# Patient Record
Sex: Female | Born: 1960 | Race: Black or African American | Hispanic: No | Smoking: Never smoker
Health system: Southern US, Community
[De-identification: ages and names within clinical notes are randomized; demographics above are authoritative.]

---

## 1997-11-10 ENCOUNTER — Emergency Department (HOSPITAL_COMMUNITY): Admission: EM | Admit: 1997-11-10 | Discharge: 1997-11-10 | Payer: Self-pay | Admitting: Family Medicine

## 1999-10-31 ENCOUNTER — Encounter: Payer: Self-pay | Admitting: Emergency Medicine

## 1999-10-31 ENCOUNTER — Emergency Department (HOSPITAL_COMMUNITY): Admission: EM | Admit: 1999-10-31 | Discharge: 1999-10-31 | Payer: Self-pay | Admitting: Emergency Medicine

## 1999-12-23 ENCOUNTER — Emergency Department (HOSPITAL_COMMUNITY): Admission: EM | Admit: 1999-12-23 | Discharge: 1999-12-24 | Payer: Self-pay | Admitting: Emergency Medicine

## 2001-10-12 ENCOUNTER — Emergency Department (HOSPITAL_COMMUNITY): Admission: EM | Admit: 2001-10-12 | Discharge: 2001-10-12 | Payer: Self-pay | Admitting: Emergency Medicine

## 2001-10-21 ENCOUNTER — Other Ambulatory Visit: Admission: RE | Admit: 2001-10-21 | Discharge: 2001-10-21 | Payer: Self-pay | Admitting: Obstetrics and Gynecology

## 2003-03-14 ENCOUNTER — Other Ambulatory Visit: Admission: RE | Admit: 2003-03-14 | Discharge: 2003-03-14 | Payer: Self-pay | Admitting: *Deleted

## 2010-02-08 ENCOUNTER — Emergency Department (HOSPITAL_COMMUNITY)
Admission: EM | Admit: 2010-02-08 | Discharge: 2010-02-09 | Payer: Self-pay | Source: Home / Self Care | Admitting: Emergency Medicine

## 2010-02-08 ENCOUNTER — Encounter: Payer: Self-pay | Admitting: *Deleted

## 2012-08-19 ENCOUNTER — Encounter (HOSPITAL_COMMUNITY): Payer: Self-pay | Admitting: Emergency Medicine

## 2012-08-19 ENCOUNTER — Emergency Department (HOSPITAL_COMMUNITY): Payer: Self-pay

## 2012-08-19 ENCOUNTER — Emergency Department (HOSPITAL_COMMUNITY)
Admission: EM | Admit: 2012-08-19 | Discharge: 2012-08-19 | Disposition: A | Discharge status: Home / Self Care | Payer: Self-pay | Attending: Emergency Medicine | Admitting: Emergency Medicine

## 2012-08-19 DIAGNOSIS — T148XXA Other injury of unspecified body region, initial encounter: Secondary | ICD-10-CM

## 2012-08-19 DIAGNOSIS — W010XXA Fall on same level from slipping, tripping and stumbling without subsequent striking against object, initial encounter: Secondary | ICD-10-CM | POA: Insufficient documentation

## 2012-08-19 DIAGNOSIS — S300XXA Contusion of lower back and pelvis, initial encounter: Secondary | ICD-10-CM | POA: Insufficient documentation

## 2012-08-19 DIAGNOSIS — Y939 Activity, unspecified: Secondary | ICD-10-CM | POA: Insufficient documentation

## 2012-08-19 DIAGNOSIS — S43401A Unspecified sprain of right shoulder joint, initial encounter: Secondary | ICD-10-CM

## 2012-08-19 DIAGNOSIS — Y92009 Unspecified place in unspecified non-institutional (private) residence as the place of occurrence of the external cause: Secondary | ICD-10-CM | POA: Insufficient documentation

## 2012-08-19 DIAGNOSIS — IMO0002 Reserved for concepts with insufficient information to code with codable children: Secondary | ICD-10-CM | POA: Insufficient documentation

## 2012-08-19 MED ORDER — TRAMADOL HCL 50 MG PO TABS: 50.0000 mg | ORAL_TABLET | Freq: Four times a day (QID) | ORAL | Status: AC | PRN

## 2012-08-19 MED ORDER — NAPROXEN 500 MG PO TABS: 500.0000 mg | ORAL_TABLET | Freq: Two times a day (BID) | ORAL | Status: AC

## 2012-08-19 MED ORDER — METHOCARBAMOL 500 MG PO TABS: 1000.0000 mg | ORAL_TABLET | Freq: Four times a day (QID) | ORAL | Status: DC

## 2012-08-19 NOTE — ED Notes (Signed)
Pt c/o fall yesterday in shower and c/o right arm and right buttocks pain after fall

## 2012-08-19 NOTE — ED Provider Notes (Signed)
CSN: [id number]     Arrival date & time 08/19/12  1545 History  This chart was scribed for non-physician practitioner Rhea Bleacher, PA-C, working with Gilda Crease, by Yevette Edwards, ED Scribe. This patient was seen in room TR09C/TR09C and the patient's care was started at 4:11 PM.   First MD Initiated Contact with Patient 08/19/12 1603     Chief Complaint  Patient presents with  . Fall    The history is provided by the patient. No language interpreter was used.   HPI Comments: Tanya Curtis is a 52 y.o. female who presents to the Emergency Department complaining of a fall which occurred yesterday morning. She injured her right shoulder, right side of her back, and buttocks on her tub as she fell. The pt states that the right shoulder and buttocks causes her the most pain, and that she noticed the pain more today than yesterday. She denies experiencing any numbness or tingling to her arms or legs. She denies hitting her head in the fall or any LOC. She reports walking more gingerly since the fall. She denies attempting to mitigate the pain with any medication, icing, or heat. The onset of this condition is acute. The course is constant. The aggravating factors are ambulation. The alleviating factors are none tried.   History reviewed. No pertinent past medical history. History reviewed. No pertinent past surgical history. History reviewed. No pertinent family history. History  Substance Use Topics  . Smoking status: Never Smoker   . Smokeless tobacco: Not on file  . Alcohol Use: No   No OB history provided.  Review of Systems  Constitutional: Negative for fatigue.  HENT: Negative for neck pain and tinnitus.   Eyes: Negative for photophobia, pain and visual disturbance.  Respiratory: Negative for shortness of breath.   Cardiovascular: Negative for chest pain.  Gastrointestinal: Negative for nausea and vomiting.  Musculoskeletal: Positive for back pain (Right-sided. ) and  arthralgias (Right shoulder and butocks. ). Negative for gait problem.  Skin: Negative for wound.  Neurological: Negative for dizziness, syncope, weakness, light-headedness, numbness and headaches.  Psychiatric/Behavioral: Negative for confusion and decreased concentration.    Allergies  Review of patient's allergies indicates no known allergies.  Home Medications   Current Outpatient Rx  Name  Route  Sig  Dispense  Refill  . methocarbamol (ROBAXIN) 500 MG tablet   Oral   Take 2 tablets (1,000 mg total) by mouth 4 (four) times daily.   20 tablet   0   . naproxen (NAPROSYN) 500 MG tablet   Oral   Take 1 tablet (500 mg total) by mouth 2 (two) times daily.   20 tablet   0   . traMADol (ULTRAM) 50 MG tablet   Oral   Take 1 tablet (50 mg total) by mouth every 6 (six) hours as needed for pain.   15 tablet   0     Triage Vitals: BP 140/79  Pulse 94  Temp(Src) 98 F (36.7 C) (Oral)  Resp 18  SpO2 99%  Physical Exam  Nursing note and vitals reviewed. Constitutional: She appears well-developed and well-nourished.  HENT:  Head: Normocephalic and atraumatic.  Eyes: Pupils are equal, round, and reactive to light.  Neck: Normal range of motion. Neck supple.  Cardiovascular: Exam reveals no decreased pulses.   Musculoskeletal: She exhibits tenderness. She exhibits no edema.       Right shoulder: She exhibits tenderness and bony tenderness. She exhibits normal range of motion.  Right hip: Normal.       Left hip: Normal.       Cervical back: Normal. She exhibits normal range of motion and no tenderness.       Thoracic back: Normal. She exhibits normal range of motion and no tenderness.       Lumbar back: Normal. She exhibits normal range of motion and no tenderness.       Arms:      Legs: Neurological: She is alert. No sensory deficit.  Motor, sensation, and vascular distal to the injury is fully intact.   Skin: Skin is warm and dry.  Psychiatric: She has a normal mood  and affect.    ED Course   DIAGNOSTIC STUDIES: Oxygen Saturation is 99% on room air, normal by my interpretation.    COORDINATION OF CARE:  4:16 PM- Discussed treatment plan with patient which includes imaging, and the patient agreed to the plan.   Procedures (including critical care time)  Labs Reviewed - No data to display Dg Shoulder Right  08/19/2012   *RADIOLOGY REPORT*  Clinical Data: Right shoulder pain after fall  RIGHT SHOULDER - 2+ VIEW  Comparison: None.  Findings: No fracture or dislocation is noted.  Joint spaces are intact. No soft tissue abnormality is noted.  Visualized ribs appear normal.  IMPRESSION: Normal right shoulder.   Original Report Authenticated By: Lupita Raider.,  M.D.   1. Shoulder sprain, right, initial encounter   2. Contusion    Patient counseled on use of narcotic pain medications. Counseled not to combine these medications with others containing tylenol. Urged not to drink alcohol, drive, or perform any other activities that requires focus while taking these medications. The patient verbalizes understanding and agrees with the plan.  Vital signs reviewed and are as follows: Filed Vitals:   08/19/12 1551  BP: 140/79  Pulse: 94  Temp: 98 F (36.7 C)  Resp: 18      MDM  Shoulder pain: X-rays negative. Will treat with splint. Treat as sprain. Counseled on rice protocol.   Buttocks pain: Suspect soft tissue pain, contusion. Patient ambulatory without difficulty. No concern for hip or leg fracture. Lower extremity neurovascularly intact without signs of compartment syndrome.   I personally performed the services described in this documentation, which was scribed in my presence. The recorded information has been reviewed and is accurate.    Renne Crigler, PA-C 08/19/12 [phone]

## 2012-08-20 NOTE — ED Provider Notes (Signed)
Medical screening examination/treatment/procedure(s) were performed by non-physician practitioner and as supervising physician I was immediately available for consultation/collaboration.   Christopher J. Pollina, MD 08/20/12 1746 

## 2012-08-22 ENCOUNTER — Emergency Department (HOSPITAL_COMMUNITY): Payer: Self-pay

## 2012-08-22 ENCOUNTER — Encounter (HOSPITAL_COMMUNITY): Payer: Self-pay | Admitting: Nurse Practitioner

## 2012-08-22 ENCOUNTER — Emergency Department (HOSPITAL_COMMUNITY)
Admission: EM | Admit: 2012-08-22 | Discharge: 2012-08-22 | Disposition: A | Discharge status: Home / Self Care | Payer: Self-pay | Attending: Emergency Medicine | Admitting: Emergency Medicine

## 2012-08-22 DIAGNOSIS — W1809XA Striking against other object with subsequent fall, initial encounter: Secondary | ICD-10-CM | POA: Insufficient documentation

## 2012-08-22 DIAGNOSIS — Y9389 Activity, other specified: Secondary | ICD-10-CM | POA: Insufficient documentation

## 2012-08-22 DIAGNOSIS — S8990XA Unspecified injury of unspecified lower leg, initial encounter: Secondary | ICD-10-CM | POA: Insufficient documentation

## 2012-08-22 DIAGNOSIS — IMO0002 Reserved for concepts with insufficient information to code with codable children: Secondary | ICD-10-CM | POA: Insufficient documentation

## 2012-08-22 DIAGNOSIS — S8991XA Unspecified injury of right lower leg, initial encounter: Secondary | ICD-10-CM

## 2012-08-22 DIAGNOSIS — Y929 Unspecified place or not applicable: Secondary | ICD-10-CM | POA: Insufficient documentation

## 2012-08-22 DIAGNOSIS — Z79899 Other long term (current) drug therapy: Secondary | ICD-10-CM | POA: Insufficient documentation

## 2012-08-22 DIAGNOSIS — R42 Dizziness and giddiness: Secondary | ICD-10-CM | POA: Insufficient documentation

## 2012-08-22 DIAGNOSIS — S00211A Abrasion of right eyelid and periocular area, initial encounter: Secondary | ICD-10-CM

## 2012-08-22 DIAGNOSIS — R55 Syncope and collapse: Secondary | ICD-10-CM | POA: Insufficient documentation

## 2012-08-22 LAB — POCT I-STAT, CHEM 8
BUN: 6 mg/dL (ref 6–23)
Chloride: 101 mEq/L (ref 96–112)
HCT: 39 % (ref 36.0–46.0)
Potassium: 3.6 mEq/L (ref 3.5–5.1)

## 2012-08-22 LAB — CBC WITH DIFFERENTIAL/PLATELET
Basophils Absolute: 0 10*3/uL (ref 0.0–0.1)
HCT: 33.9 % — ABNORMAL LOW (ref 36.0–46.0)
Hemoglobin: 11.5 g/dL — ABNORMAL LOW (ref 12.0–15.0)
Lymphocytes Relative: 45 % (ref 12–46)
Lymphs Abs: 4 10*3/uL (ref 0.7–4.0)
MCV: 86.9 fL (ref 78.0–100.0)
Monocytes Absolute: 0.8 10*3/uL (ref 0.1–1.0)
Monocytes Relative: 9 % (ref 3–12)
Neutro Abs: 3.8 10*3/uL (ref 1.7–7.7)
RBC: 3.9 MIL/uL (ref 3.87–5.11)
WBC: 8.8 10*3/uL (ref 4.0–10.5)

## 2012-08-22 LAB — GLUCOSE, CAPILLARY: Glucose-Capillary: 111 mg/dL — ABNORMAL HIGH (ref 70–99)

## 2012-08-22 MED ORDER — IBUPROFEN 800 MG PO TABS
800.0000 mg | ORAL_TABLET | Freq: Once | ORAL | Status: AC
  Administered 2012-08-22: 800 mg via ORAL
  Filled 2012-08-22: qty 1

## 2012-08-22 NOTE — ED Notes (Signed)
Pt refuses ibuprofen and wants to take it after CT scan.

## 2012-08-22 NOTE — ED Notes (Signed)
Pt reports she stood up from sitting position today and felt lightheaded and fell landing on R side of face. R sided facial swelling, 2 lacerations to R eyelid, and pain to R knee. Pt denies LOC. Pt is A&Ox4 now, ambulatory

## 2012-08-22 NOTE — ED Provider Notes (Signed)
CSN: [id number]     Arrival date & time 08/22/12  1508 History     First MD Initiated Contact with Patient 08/22/12 1831     Chief Complaint  Patient presents with  . Fall   (Consider location/radiation/quality/duration/timing/severity/associated sxs/prior Treatment) The history is provided by the patient.  Tanya Curtis is a 52 y.o. female here with dizziness and fall. She got up sitting position and suddenly felt lightheaded and dizzy and may have passed out and hit R face. Denies chest pain or shortness of breath. She hit her head and R knee as well and has abrasion R eyebrow. Tetanus up to date.    History reviewed. No pertinent past medical history. History reviewed. No pertinent past surgical history. History reviewed. No pertinent family history. History  Substance Use Topics  . Smoking status: Never Smoker   . Smokeless tobacco: Not on file  . Alcohol Use: No   OB History   Grav Para Term Preterm Abortions TAB SAB Ect Mult Living                 Review of Systems  Musculoskeletal:       R eyebrow abrasion   Neurological: Positive for dizziness and syncope.  All other systems reviewed and are negative.    Allergies  Review of patient's allergies indicates no known allergies.  Home Medications   Current Outpatient Rx  Name  Route  Sig  Dispense  Refill  . methocarbamol (ROBAXIN) 500 MG tablet   Oral   Take 1,000 mg by mouth 4 (four) times daily.         . naproxen (NAPROSYN) 500 MG tablet   Oral   Take 1 tablet (500 mg total) by mouth 2 (two) times daily.   20 tablet   0   . traMADol (ULTRAM) 50 MG tablet   Oral   Take 1 tablet (50 mg total) by mouth every 6 (six) hours as needed for pain.   15 tablet   0    BP 157/86  Pulse 89  Temp(Src) 98.3 F (36.8 C) (Oral)  Resp 16  SpO2 100%  LMP 08/04/2012 Physical Exam  Nursing note and vitals reviewed. Constitutional: She is oriented to person, place, and time. She appears well-developed and  well-nourished.  HENT:  Head: Normocephalic.  Mouth/Throat: Oropharynx is clear and moist.  R eyebrow slightly swollen and tender. Small abrasion R eyelid and R cheek. Jaw doesn't appear dislocated.   Eyes: Conjunctivae are normal. Pupils are equal, round, and reactive to light.  Neck: Normal range of motion. Neck supple.  No midline tenderness   Cardiovascular: Normal rate, regular rhythm and normal heart sounds.   Pulmonary/Chest: Effort normal and breath sounds normal. No respiratory distress. She has no wheezes. She has no rales.  Abdominal: Soft. Bowel sounds are normal. She exhibits no distension. There is no tenderness. There is no rebound and no guarding.  Musculoskeletal: Normal range of motion.  R knee dec ROM. Mild tenderness on knee.   Neurological: She is alert and oriented to person, place, and time.  Skin: Skin is warm and dry.  Psychiatric: She has a normal mood and affect. Her behavior is normal. Judgment and thought content normal.    ED Course   Procedures (including critical care time)  Labs Reviewed  GLUCOSE, CAPILLARY - Abnormal; Notable for the following:    Glucose-Capillary 111 (*)    All other components within normal limits  CBC WITH DIFFERENTIAL - Abnormal;  Notable for the following:    Hemoglobin 11.5 (*)    HCT 33.9 (*)    All other components within normal limits  POCT I-STAT, CHEM 8 - Abnormal; Notable for the following:    Glucose, Bld 113 (*)    All other components within normal limits  POCT I-STAT TROPONIN I   Ct Head Wo Contrast  08/22/2012   *RADIOLOGY REPORT*  Clinical Data:  FALL, SYNCOPAL EPISODE, LACERATION RIGHT ORBIT  CT HEAD WITHOUT CONTRAST CT MAXILLOFACIAL WITHOUT CONTRAST  Technique:  Multidetector CT imaging of the head and maxillofacial structures were performed using the standard protocol without intravenous contrast. Multiplanar CT image reconstructions of the maxillofacial structures were also generated.  Comparison:   None.  CT  HEAD  Findings: Metallic artifact related to the metal within the patient's hair.  This creates streak artifact.  Within the limits the study, no acute intracranial hemorrhage, definite infarction, mass lesion, midline shift, herniation, hydrocephalus, or extra-axial fluid collection.  Gray-white matter differentiation maintained. Cisterns patent.  No cerebellar abnormality.  Symmetric orbits. Mastoids and sinuses clear.  IMPRESSION: No acute intracranial finding.  Limited exam with artifact.  CT MAXILLOFACIAL  Findings:   Minor right orbital and maxillary soft tissue swelling/bruising.  Dental hardware creates streak artifact.  No definite displaced facial fracture.  Symmetric orbits.  Mandible, maxilla, zygomas, skull base, orbits, nasal septum, and nasal bones all appear intact.  Sinuses remain clear.  No orbital blowout fracture.  IMPRESSION: No acute facial bony trauma or displaced fracture.  Minor right orbital and maxillary soft tissue swelling/bruising.  No sinus opacification or hemorrhage.  Sinuses clear.   Original Report Authenticated By: Judie Petit. Shick, M.D.   Dg Knee Complete 4 Views Right  08/22/2012   *RADIOLOGY REPORT*  Clinical Data: Anterior knee pain status post fall today  RIGHT KNEE - COMPLETE 4+ VIEW  Comparison: None.  Findings: There are degenerative joint changes of right knee with narrowed joint space and osteophytosis.  On the frontal view, there is cortical discontinuity with lucency in the distal femur. This is not confirmed on the three other views.  IMPRESSION:  On the frontal view, there is cortical discontinuity with lucency in the distal femur. This is not confirmed on the three other views. This could be due to chronic change but if the patient has focal pain here, fracture is not as occluded.  Osteoarthritic changes of right knee.   Original Report Authenticated By: Sherian Rein, M.D.   Ct Maxillofacial Wo Cm  08/22/2012   *RADIOLOGY REPORT*  Clinical Data:  FALL, SYNCOPAL EPISODE,  LACERATION RIGHT ORBIT  CT HEAD WITHOUT CONTRAST CT MAXILLOFACIAL WITHOUT CONTRAST  Technique:  Multidetector CT imaging of the head and maxillofacial structures were performed using the standard protocol without intravenous contrast. Multiplanar CT image reconstructions of the maxillofacial structures were also generated.  Comparison:   None.  CT HEAD  Findings: Metallic artifact related to the metal within the patient's hair.  This creates streak artifact.  Within the limits the study, no acute intracranial hemorrhage, definite infarction, mass lesion, midline shift, herniation, hydrocephalus, or extra-axial fluid collection.  Gray-white matter differentiation maintained. Cisterns patent.  No cerebellar abnormality.  Symmetric orbits. Mastoids and sinuses clear.  IMPRESSION: No acute intracranial finding.  Limited exam with artifact.  CT MAXILLOFACIAL  Findings:   Minor right orbital and maxillary soft tissue swelling/bruising.  Dental hardware creates streak artifact.  No definite displaced facial fracture.  Symmetric orbits.  Mandible, maxilla, zygomas, skull base, orbits,  nasal septum, and nasal bones all appear intact.  Sinuses remain clear.  No orbital blowout fracture.  IMPRESSION: No acute facial bony trauma or displaced fracture.  Minor right orbital and maxillary soft tissue swelling/bruising.  No sinus opacification or hemorrhage.  Sinuses clear.   Original Report Authenticated By: Judie Petit. Miles Costain, M.D.   No diagnosis found.   Date: 08/22/2012  Rate: 86  Rhythm: normal sinus rhythm  QRS Axis: normal  Intervals: normal  ST/T Wave abnormalities: nonspecific ST changes  Conduction Disutrbances:none  Narrative Interpretation:   Old EKG Reviewed: none available    MDM  Tanya Curtis is a 52 y.o. female here with syncope. EKG unremarkable. Labs nl. CT head/face showed no fracture. I cleaned up abrasion and there is no laceration. Xray R knee showed arthritis but possible fracture, correlate  clinically. Patient has no pain on ambulation and doesn't want knee immobilizer or crutches. She will f/u with ortho to get repeat xray.     Richardean Canal, MD 08/22/12 [phone]

## 2014-05-09 ENCOUNTER — Encounter (HOSPITAL_COMMUNITY): Payer: Self-pay | Admitting: *Deleted

## 2014-05-09 ENCOUNTER — Emergency Department (HOSPITAL_COMMUNITY)
Admission: EM | Admit: 2014-05-09 | Discharge: 2014-05-09 | Disposition: A | Discharge status: Home / Self Care | Payer: No Typology Code available for payment source | Attending: Emergency Medicine | Admitting: Emergency Medicine

## 2014-05-09 DIAGNOSIS — S161XXA Strain of muscle, fascia and tendon at neck level, initial encounter: Secondary | ICD-10-CM | POA: Insufficient documentation

## 2014-05-09 DIAGNOSIS — S0990XA Unspecified injury of head, initial encounter: Secondary | ICD-10-CM | POA: Insufficient documentation

## 2014-05-09 DIAGNOSIS — Y9389 Activity, other specified: Secondary | ICD-10-CM | POA: Diagnosis not present

## 2014-05-09 DIAGNOSIS — Z791 Long term (current) use of non-steroidal anti-inflammatories (NSAID): Secondary | ICD-10-CM | POA: Insufficient documentation

## 2014-05-09 DIAGNOSIS — Y92410 Unspecified street and highway as the place of occurrence of the external cause: Secondary | ICD-10-CM | POA: Insufficient documentation

## 2014-05-09 DIAGNOSIS — Z79899 Other long term (current) drug therapy: Secondary | ICD-10-CM | POA: Diagnosis not present

## 2014-05-09 DIAGNOSIS — Y998 Other external cause status: Secondary | ICD-10-CM | POA: Diagnosis not present

## 2014-05-09 DIAGNOSIS — S199XXA Unspecified injury of neck, initial encounter: Secondary | ICD-10-CM | POA: Diagnosis present
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
## 2014-08-18 IMAGING — CT CT HEAD W/O CM
3 of 5 series · 16 of 47 positions shown, 19 images · non-contrast
Comparison: None.

CT HEAD

CLINICAL DATA: FALL, SYNCOPAL EPISODE, LACERATION RIGHT ORBIT

CT HEAD WITHOUT CONTRAST
CT MAXILLOFACIAL WITHOUT CONTRAST
TECHNIQUE: Multidetector CT imaging of the head and maxillofacial
structures were performed using the standard protocol without
intravenous contrast. Multiplanar CT image reconstructions of the
maxillofacial structures were also generated.

[Series 4: facial bones · axial · 0.43mm/px · z∈[+921,+1066]mm · 10 of 86 slices shown, 13 images]
[im 7/86  brain]
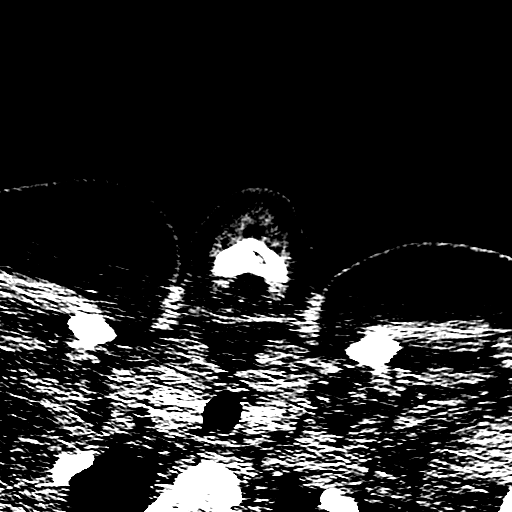
[im 7/86  bone]
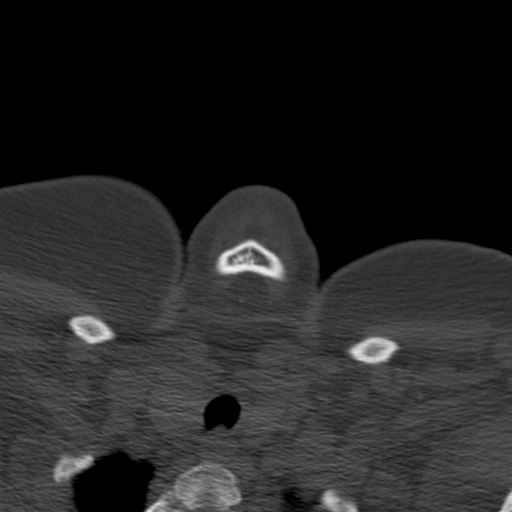
[im 14/86  brain]
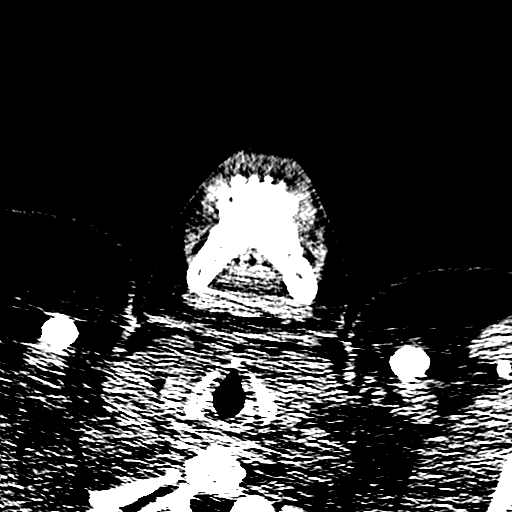
[im 27/86  brain]
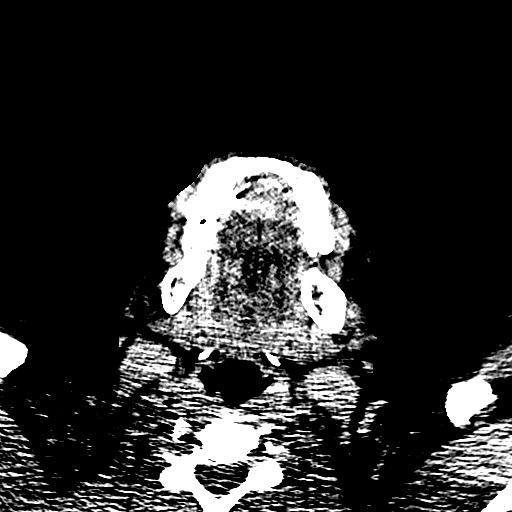
[im 33/86  brain]
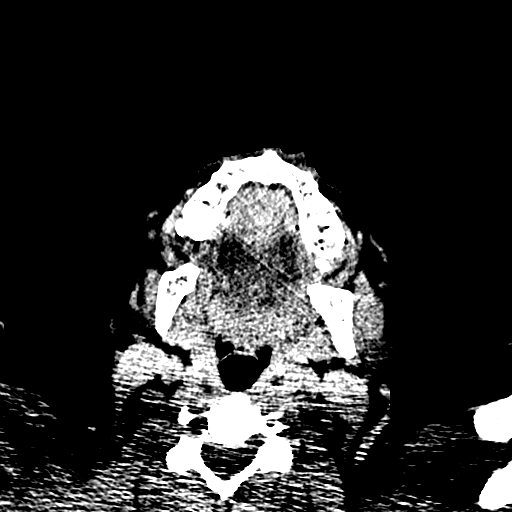
[im 40/86  brain]
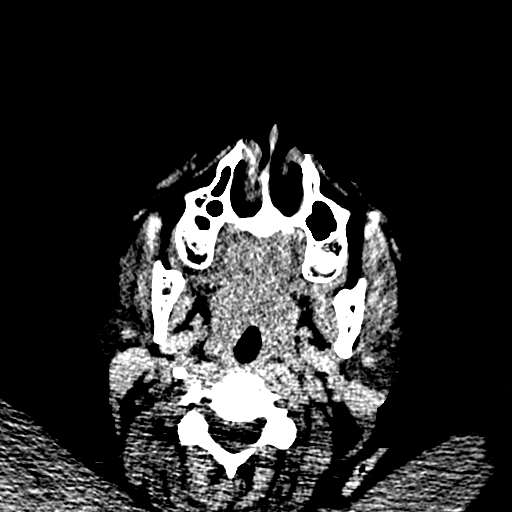
[im 40/86  bone]
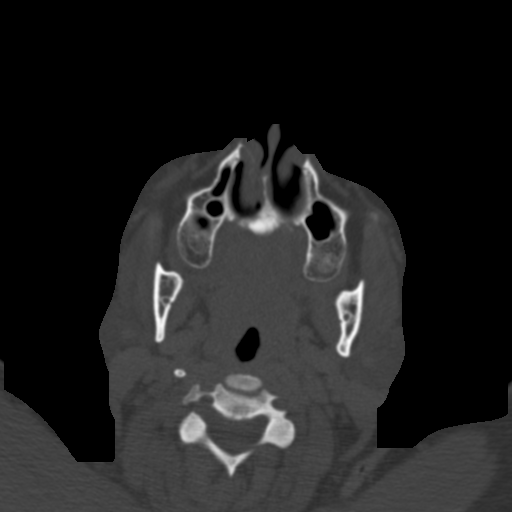
[im 46/86  brain]
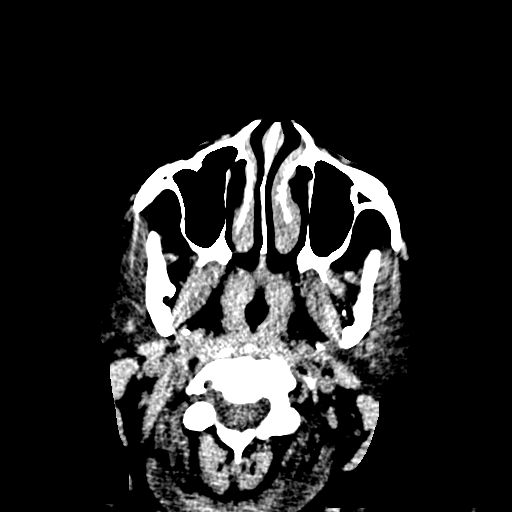
[im 53/86  brain]
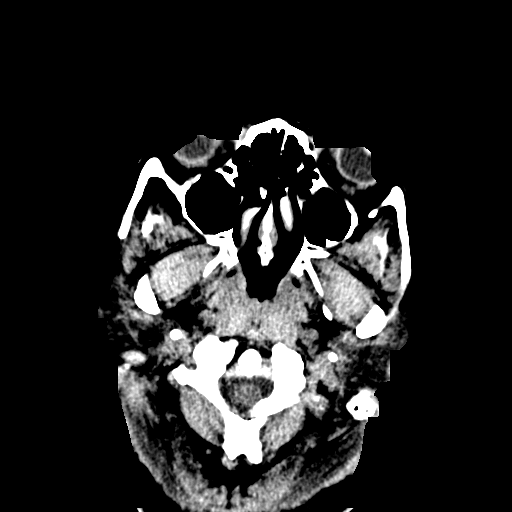
[im 66/86  brain]
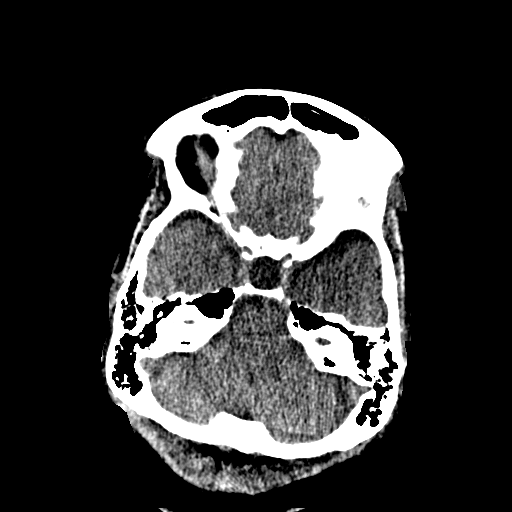
[im 72/86  brain]
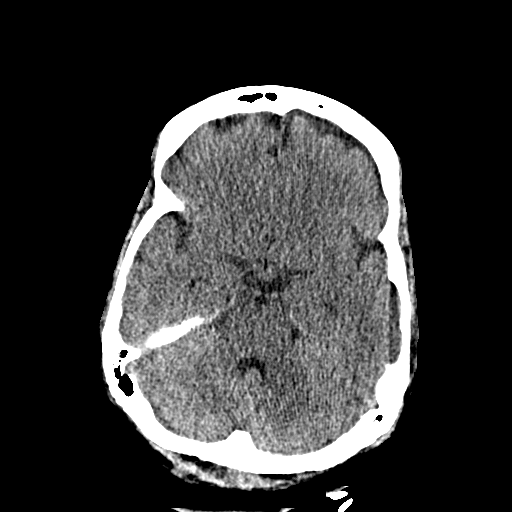
[im 72/86  bone]
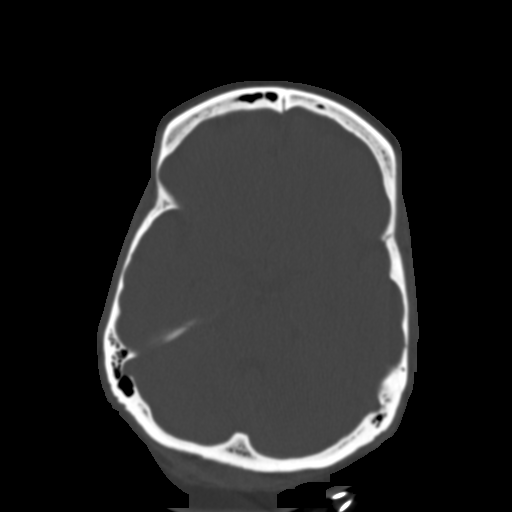
[im 79/86  brain]
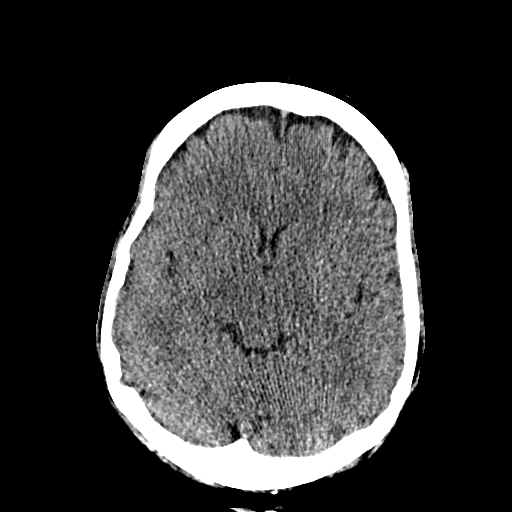

[mpr, coronal std, coronal · coronal · 0.43mm/px · 3 of 73 slices shown]
[im 25/73  brain]
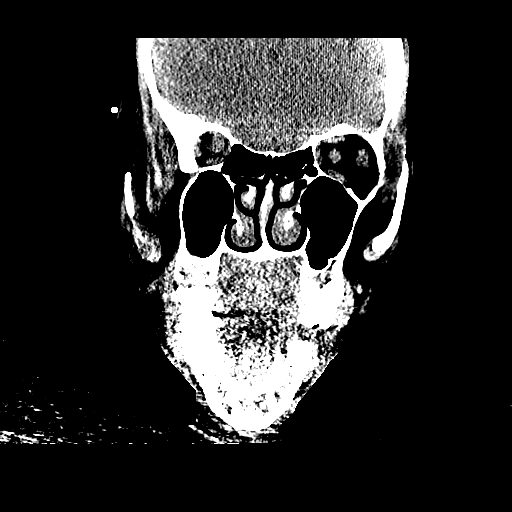
[im 33/73  brain]
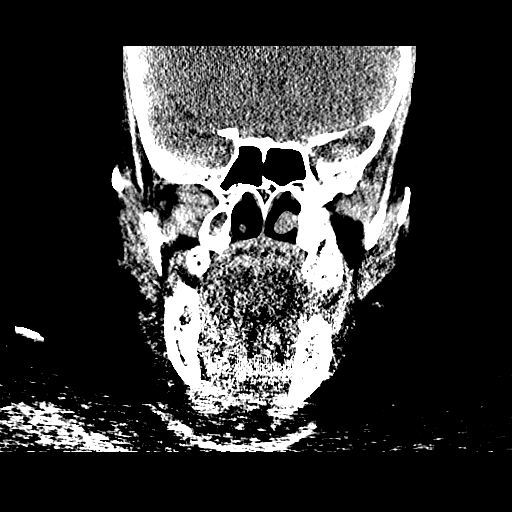
[im 41/73  brain]
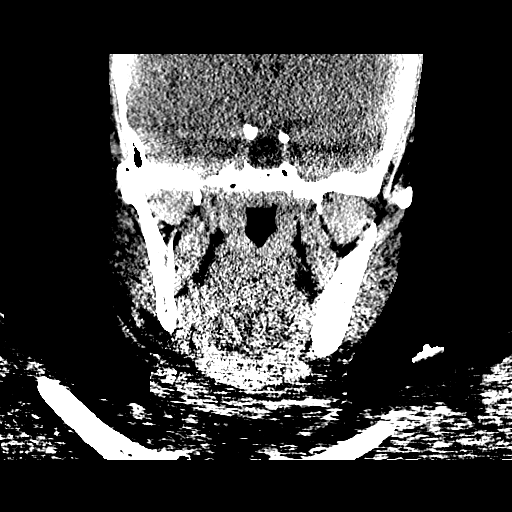

[mpr, sagittal std, sagittal · sagittal · 0.43mm/px · 3 of 74 slices shown]
[im 25/74  brain]
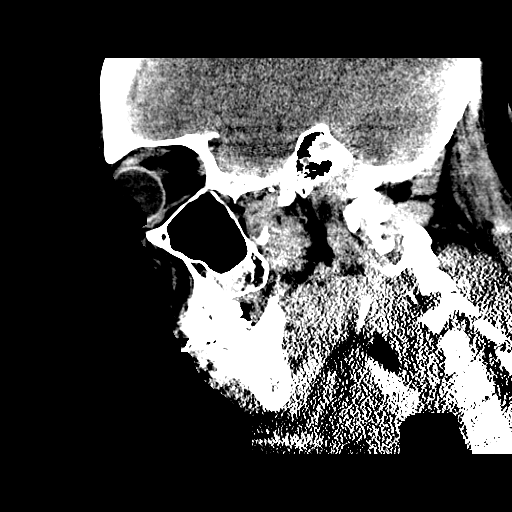
[im 37/74  brain]
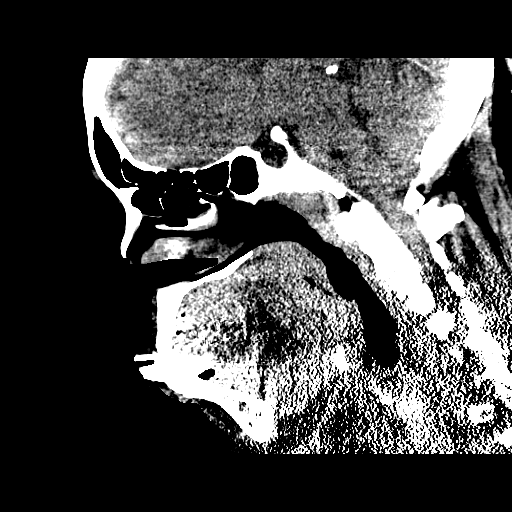
[im 49/74  brain]
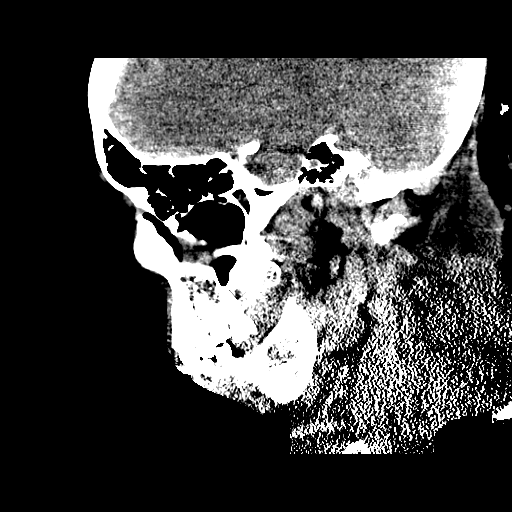

[16 of 47 positions shown; findings below may reference images not displayed]

FINDINGS: Metallic artifact related to the metal within the
patient's hair.

This creates streak artifact.  Within the limits the study, no
acute intracranial hemorrhage, definite infarction, mass lesion,
midline shift, herniation, hydrocephalus, or extra-axial fluid
collection.  Gray-white matter differentiation maintained.
Cisterns patent.  No cerebellar abnormality.  Symmetric orbits.
Mastoids and sinuses clear.
IMPRESSION: No acute intracranial finding.  Limited exam with artifact.

CT MAXILLOFACIAL
FINDINGS: Minor right orbital and maxillary soft tissue
swelling/bruising.  Dental hardware creates streak artifact.  No
definite displaced facial fracture.  Symmetric orbits.  Mandible,
maxilla, zygomas, skull base, orbits, nasal septum, and nasal bones
all appear intact.  Sinuses remain clear.  No orbital blowout
fracture.
IMPRESSION: No acute facial bony trauma or displaced fracture.

Minor right orbital and maxillary soft tissue swelling/bruising.

No sinus opacification or hemorrhage.  Sinuses clear.

## 2014-08-18 IMAGING — CR DG KNEE COMPLETE 4+V*R*
4 series · 4 of 4 positions shown · non-contrast
Comparison: None.

CLINICAL DATA: Anterior knee pain status post fall today

RIGHT KNEE - COMPLETE 4+ VIEW

[t knee ap right]
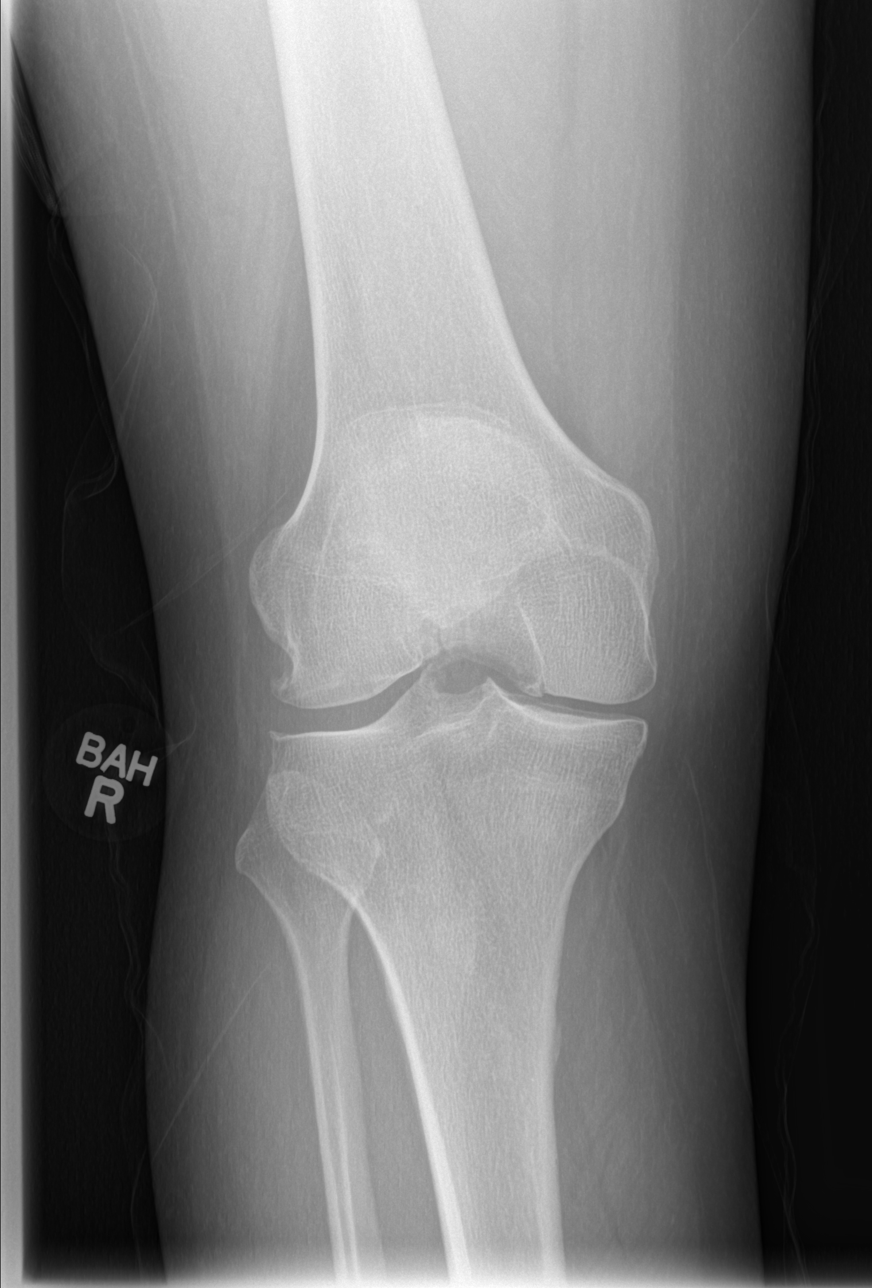

[t knee oblique right (1 of 2)]
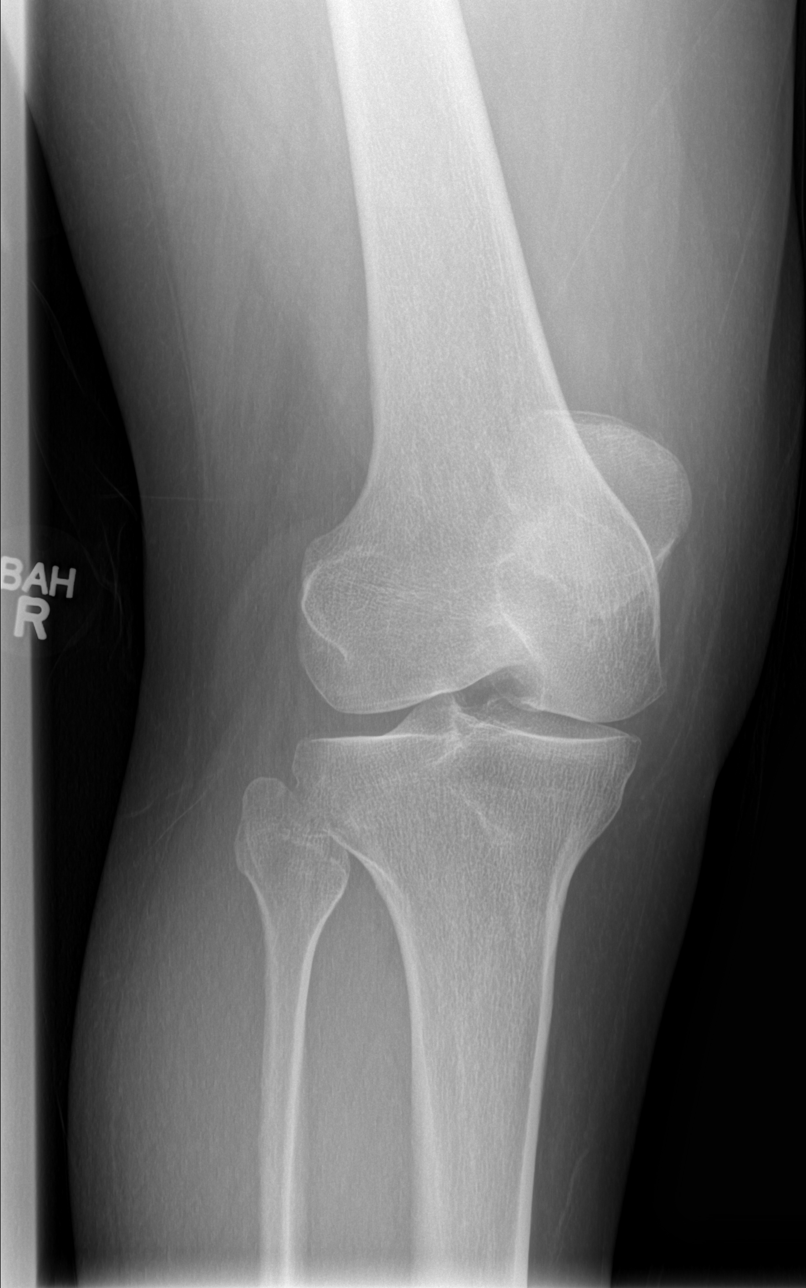

[t knee oblique right (2 of 2)]
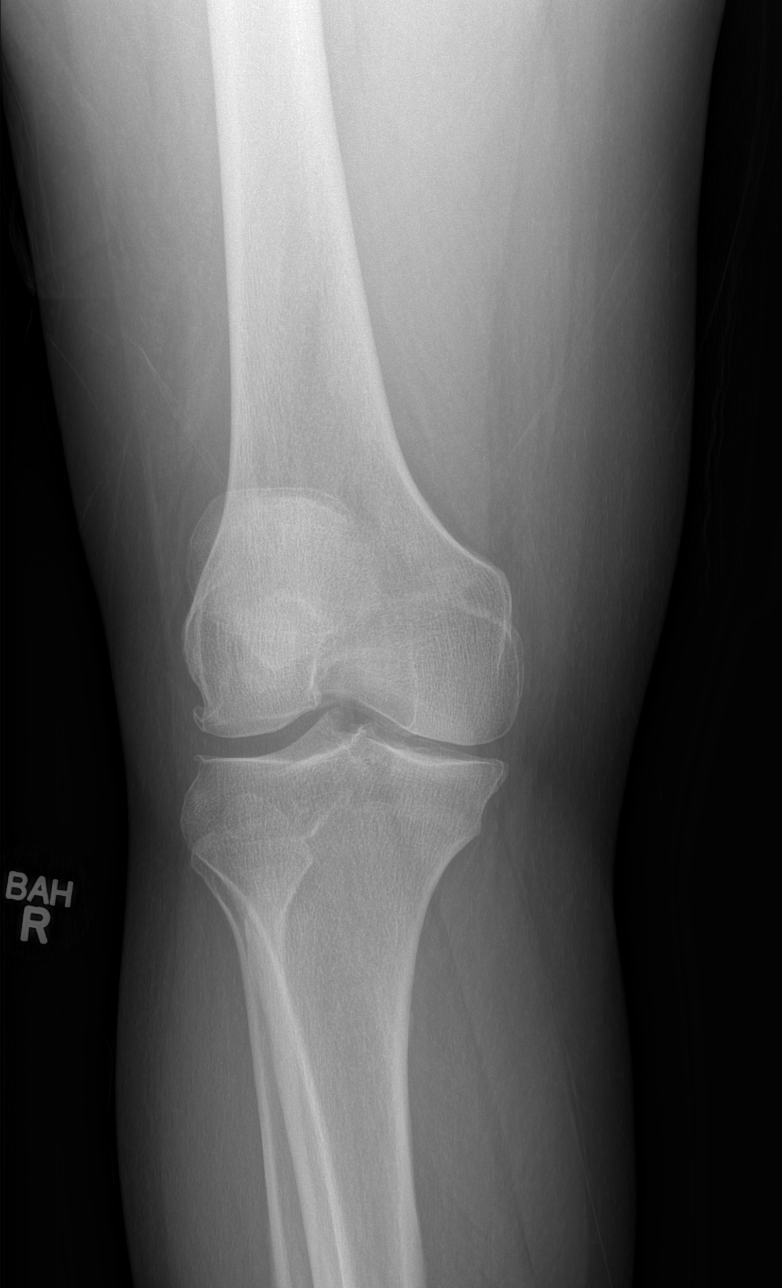

[t knee lat right]
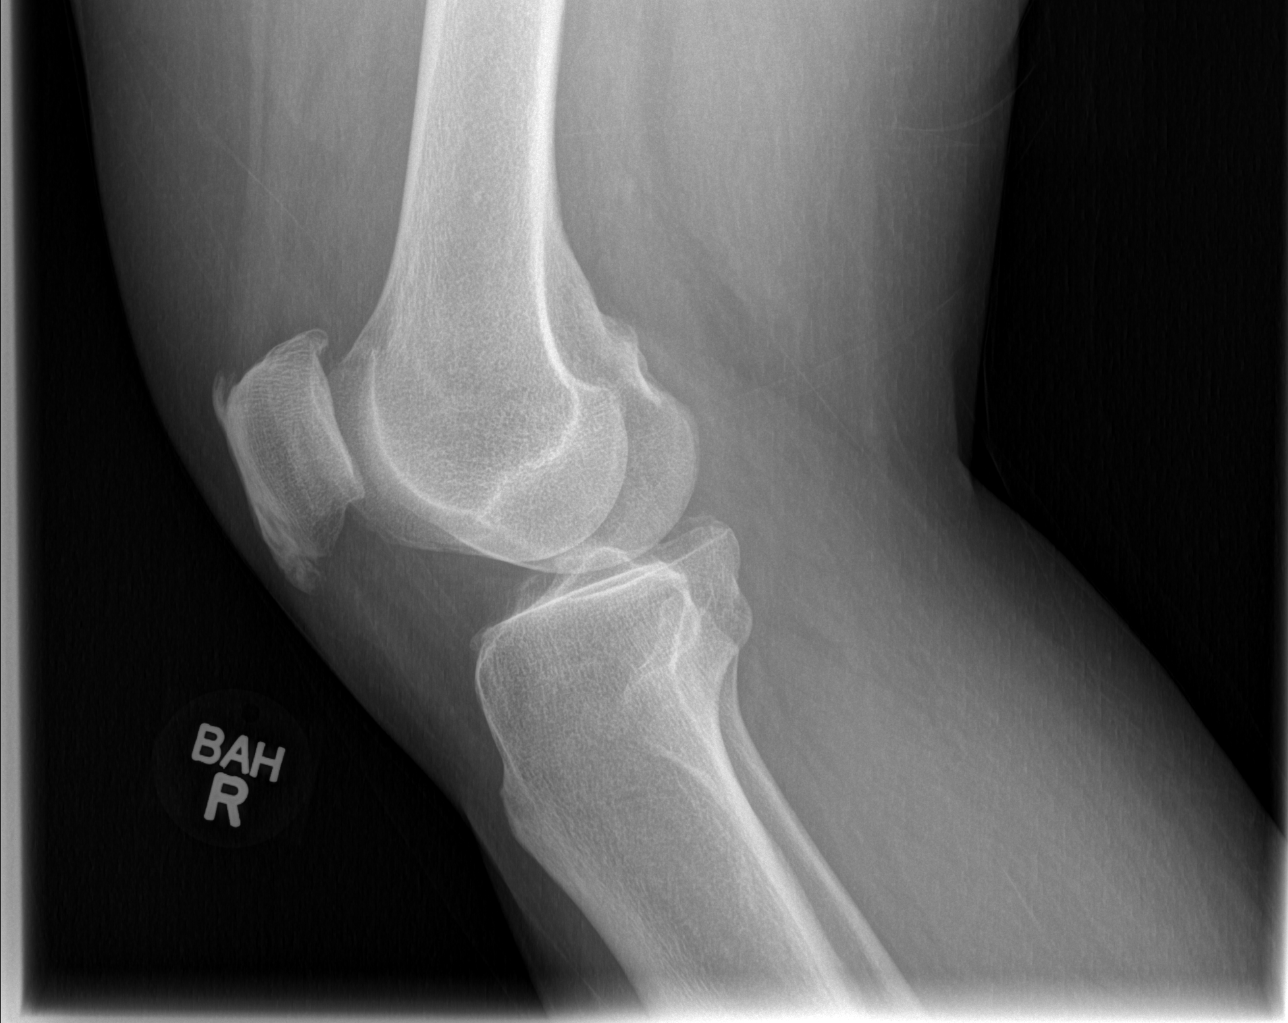

[4 of 4 positions shown; findings below may reference images not displayed]

FINDINGS: There are degenerative joint changes of right knee with
narrowed joint space and osteophytosis.  On the frontal view, there
is cortical discontinuity with lucency in the distal femur. This is
not confirmed on the three other views.
IMPRESSION: On the frontal view, there is cortical discontinuity with lucency
in the distal femur. This is not confirmed on the three other
views. This could be due to chronic change but if the patient has
focal pain here, fracture is not as occluded.  Osteoarthritic
changes of right knee.]

## 2018-03-21 ENCOUNTER — Other Ambulatory Visit: Payer: Self-pay

## 2018-03-21 ENCOUNTER — Emergency Department (HOSPITAL_COMMUNITY): Payer: Self-pay

## 2018-03-21 ENCOUNTER — Emergency Department (HOSPITAL_COMMUNITY)
Admission: EM | Admit: 2018-03-21 | Discharge: 2018-03-21 | Disposition: A | Discharge status: Home / Self Care | Payer: Self-pay | Attending: Emergency Medicine | Admitting: Emergency Medicine

## 2018-03-21 ENCOUNTER — Encounter (HOSPITAL_COMMUNITY): Payer: Self-pay | Admitting: Obstetrics and Gynecology

## 2018-03-21 DIAGNOSIS — Y999 Unspecified external cause status: Secondary | ICD-10-CM | POA: Insufficient documentation

## 2018-03-21 DIAGNOSIS — Z79899 Other long term (current) drug therapy: Secondary | ICD-10-CM | POA: Insufficient documentation

## 2018-03-21 DIAGNOSIS — Y929 Unspecified place or not applicable: Secondary | ICD-10-CM | POA: Insufficient documentation

## 2018-03-21 DIAGNOSIS — W101XXA Fall (on)(from) sidewalk curb, initial encounter: Secondary | ICD-10-CM | POA: Insufficient documentation

## 2018-03-21 DIAGNOSIS — S62111A Displaced fracture of triquetrum [cuneiform] bone, right wrist, initial encounter for closed fracture: Secondary | ICD-10-CM | POA: Insufficient documentation

## 2018-03-21 DIAGNOSIS — Y9301 Activity, walking, marching and hiking: Secondary | ICD-10-CM | POA: Insufficient documentation

## 2018-03-21 DIAGNOSIS — W19XXXA Unspecified fall, initial encounter: Secondary | ICD-10-CM

## 2018-03-21 MED ORDER — IBUPROFEN 800 MG PO TABS
800.0000 mg | ORAL_TABLET | Freq: Once | ORAL | Status: AC
  Administered 2018-03-21: 800 mg via ORAL
  Filled 2018-03-21: qty 1

## 2018-03-21 MED ORDER — HYDROCODONE-ACETAMINOPHEN 5-325 MG PO TABS: 1.0000 | ORAL_TABLET | ORAL | 0 refills | Status: AC | PRN

## 2018-03-21 NOTE — Discharge Instructions (Signed)
Take the prescribed medication as directed.  Can take motrin between does.  If vicodin makes you drowsy, reserve for home use and rely on motrin at work. Follow-up with Dr. Amanda Pea-- call this morning to make follow-up appt. Return to the ED for new or worsening symptoms.

## 2018-03-21 NOTE — ED Triage Notes (Signed)
Pt reports she fell off the curb and scraped her hands and landed on her right arm. Pt reports pain 8/10 and is ambulatory and alert in triage. No LOC

## 2018-03-21 NOTE — ED Provider Notes (Signed)
[facility] Provider Note   CSN: [id number] Arrival date & time: 03/21/18  0008    History   Chief Complaint Chief Complaint  Patient presents with  . Fall  . Arm Pain    HPI Tanya Curtis is a 58 y.o. female.     The history is provided by the patient and medical records.  Fall   Arm Pain     58 year old female presenting to the ED after a fall.  States she was walking outside from her work and lost her footing of the curb and fell onto the concrete.  There was no head injury or loss of consciousness.  She attempted to brace her fall with her right arm and subsequently now has worsening right wrist and arm pain.  She denies any numbness or weakness.  She has not had any medications prior to arrival.  She is right-hand dominant.  No past medical history on file.  There are no active problems to display for this patient.   No past surgical history on file.   OB History   No obstetric history on file.      Home Medications    Prior to Admission medications   Medication Sig Start Date End Date Taking? Authorizing Provider  ibuprofen (ADVIL,MOTRIN) 200 MG tablet Take 200 mg by mouth every 6 (six) hours as needed for moderate pain.   Yes [provider]  Multiple Vitamin (MULTIVITAMIN WITH MINERALS) TABS tablet Take 1 tablet by mouth daily.   Yes [provider]  naproxen (NAPROSYN) 500 MG tablet Take 1 tablet (500 mg total) by mouth 2 (two) times daily. Patient not taking: Reported on 03/21/2018 08/19/12   Renne Crigler, PA-C  traMADol (ULTRAM) 50 MG tablet Take 1 tablet (50 mg total) by mouth every 6 (six) hours as needed for pain. Patient not taking: Reported on 03/21/2018 08/19/12   Renne Crigler, PA-C    Family History No family history on file.  Social History Social History   Tobacco Use  . Smoking status: Never Smoker  . Smokeless tobacco: Never Used  Substance Use Topics  . Alcohol use: No  . Drug  use: No     Allergies   Patient has no known allergies.   Review of Systems Review of Systems  Musculoskeletal: Positive for arthralgias.  All other systems reviewed and are negative.    Physical Exam Updated Vital Signs BP 124/63 (BP Location: Left Arm)   Pulse 71   Temp 97.9 F (36.6 C) (Oral)   Resp 20   SpO2 100%   Physical Exam Vitals signs and nursing note reviewed.  Constitutional:      Appearance: She is well-developed.  HENT:     Head: Normocephalic and atraumatic.  Eyes:     Conjunctiva/sclera: Conjunctivae normal.     Pupils: Pupils are equal, round, and reactive to light.  Neck:     Musculoskeletal: Normal range of motion.  Cardiovascular:     Rate and Rhythm: Normal rate and regular rhythm.     Heart sounds: Normal heart sounds.  Pulmonary:     Effort: Pulmonary effort is normal.     Breath sounds: Normal breath sounds.  Abdominal:     General: Bowel sounds are normal.     Palpations: Abdomen is soft.  Musculoskeletal: Normal range of motion.     Comments: Small abrasions noted to the base of both palms without any active bleeding or lacerations Right wrist with small deformity  along ulnar aspect, this is minor, there is no skin tenting, pain noted with flexion and extension of the wrist, able to move all fingers normally, normal distal sensation and perfusion Forearm and upper arm are nontender without any visible signs of trauma  Skin:    General: Skin is warm and dry.  Neurological:     Mental Status: She is alert and oriented to person, place, and time.      ED Treatments / Results  Labs (all labs ordered are listed, but only abnormal results are displayed) Labs Reviewed - No data to display  EKG None  Radiology Dg Forearm Right  Result Date: 03/21/2018 CLINICAL DATA:  Fall off curb with right arm pain. EXAM: RIGHT FOREARM - 2 VIEW COMPARISON:  None. FINDINGS: Cortical margins of the radius and ulna are intact. There is no evidence of  fracture or other focal bone lesions. Wrist and elbow alignment are grossly maintained. Soft tissues are unremarkable. IMPRESSION: Negative radiographs of the right forearm. Electronically Signed   By: Narda Rutherford M.D.   On: 03/21/2018 01:44   Dg Wrist Complete Right  Result Date: 03/21/2018 CLINICAL DATA:  Initial evaluation for acute pain status post fall. EXAM: RIGHT WRIST - COMPLETE 3+ VIEW COMPARISON:  None. FINDINGS: On lateral view, there is a tiny osseous density at the dorsal aspect of the proximal carpals, suspicious for a small acute triquetral fracture, age indeterminate. No other acute fracture or dislocation. Mild osteoarthritic changes about the visualized hand and wrist. Osseous mineralization normal. No soft tissue abnormality. IMPRESSION: 1. Tiny osseous fragment at the dorsal aspect of the proximal right hand, consistent with a small triquetral fracture. Finding is somewhat age indeterminate, but could be acute in nature. Correlation with physical exam for possible pain at this location recommended. 2. No other acute osseous abnormality about the wrist. Electronically Signed   By: Rise Mu M.D.   On: 03/21/2018 03:06   Dg Humerus Right  Result Date: 03/21/2018 CLINICAL DATA:  Fall off curb with right arm pain. EXAM: RIGHT HUMERUS - 2+ VIEW COMPARISON:  None. FINDINGS: Cortical margins of the right humerus are intact. There is no evidence of fracture or other focal bone lesions. Shoulder and elbow alignment grossly maintained. Soft tissues are unremarkable. IMPRESSION: Negative radiographs of the right humerus. Electronically Signed   By: Narda Rutherford M.D.   On: 03/21/2018 01:44    Procedures Procedures (including critical care time)  Medications Ordered in ED Medications  ibuprofen (ADVIL,MOTRIN) tablet 800 mg (800 mg Oral Given 03/21/18 0236)     Initial Impression / Assessment and Plan / ED Course  I have reviewed the triage vital signs and the nursing  notes.  Pertinent labs & imaging results that were available during my care of the patient were reviewed by me and considered in my medical decision making (see chart for details).  58 year old female here with right wrist and arm pain after fall.  She lost her footing on the curb and attempted to brace fall with right arm.  No head injury or loss of consciousness.  She has abrasions to the base of both palms without any active bleeding or lacerations.  Small deformity along the distal ulnar aspect of right wrist, no skin tenting or other skin compromise.  Remainder of arm is overall benign and atraumatic.  Her arm is neurovascularly intact.  Forearm and humerus x-rays were obtained from triage which are negative, however symptoms mostly localized to the wrist.  Wrist films  were added and does appear to have a small triquetral fracture.  Patient was placed in volar wrist splint.  She is been seen by hand surgery, Dr. Amanda Pea, in the past so will refer back to him for follow-up of this injury.  She is placed on light duty at work pending clearance by orthopedics.  Rx Vicodin for pain.   She can return here for any new/acute changes.  Final Clinical Impressions(s) / ED Diagnoses   Final diagnoses:  Fall, initial encounter  Closed chip fracture of triquetrum of right wrist, initial encounter    ED Discharge Orders         Ordered    HYDROcodone-acetaminophen (NORCO/VICODIN) 5-325 MG tablet  Every 4 hours PRN     03/21/18 0501           Garlon Hatchet, PA-C 03/21/18 0510    Gilda Crease, MD 03/21/18 [phone]

## 2021-05-06 ENCOUNTER — Ambulatory Visit: Payer: Self-pay | Admitting: Allergy & Immunology

## 2022-09-18 ENCOUNTER — Ambulatory Visit (HOSPITAL_COMMUNITY)
Admission: EM | Admit: 2022-09-18 | Discharge: 2022-09-18 | Disposition: A | Discharge status: Home / Self Care | Payer: Self-pay | Attending: Internal Medicine | Admitting: Internal Medicine

## 2022-09-18 ENCOUNTER — Encounter (HOSPITAL_COMMUNITY): Payer: Self-pay | Admitting: *Deleted

## 2022-09-18 DIAGNOSIS — M62830 Muscle spasm of back: Secondary | ICD-10-CM

## 2022-09-18 MED ORDER — IBUPROFEN 600 MG PO TABS: 600.0000 mg | ORAL_TABLET | Freq: Four times a day (QID) | ORAL | 0 refills | Status: AC | PRN

## 2022-09-18 MED ORDER — BACLOFEN 10 MG PO TABS: 10.0000 mg | ORAL_TABLET | Freq: Three times a day (TID) | ORAL | 0 refills | Status: AC
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
